# Patient Record
Sex: Female | Born: 1970 | Race: White | Hispanic: No | Marital: Married | Smoking: Former smoker
Health system: Southern US, Community
[De-identification: ages and names within clinical notes are randomized; demographics above are authoritative.]

## PROBLEM LIST (undated history)

## (undated) DIAGNOSIS — F32A Depression, unspecified: Secondary | ICD-10-CM

## (undated) DIAGNOSIS — F329 Major depressive disorder, single episode, unspecified: Secondary | ICD-10-CM

## (undated) DIAGNOSIS — R519 Headache, unspecified: Secondary | ICD-10-CM

## (undated) DIAGNOSIS — I10 Essential (primary) hypertension: Secondary | ICD-10-CM

## (undated) HISTORY — PX: CERVICAL CONE BIOPSY: SUR198

## (undated) HISTORY — PX: CYST EXCISION: SHX5701

---
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
## 2018-04-30 ENCOUNTER — Other Ambulatory Visit: Payer: Self-pay | Admitting: Obstetrics and Gynecology

## 2018-04-30 DIAGNOSIS — Z1231 Encounter for screening mammogram for malignant neoplasm of breast: Secondary | ICD-10-CM

## 2018-06-14 ENCOUNTER — Ambulatory Visit: Payer: 59

## 2018-09-04 ENCOUNTER — Other Ambulatory Visit: Payer: Self-pay

## 2018-09-04 ENCOUNTER — Ambulatory Visit
Admission: RE | Admit: 2018-09-04 | Discharge: 2018-09-04 | Disposition: A | Discharge status: Home / Self Care | Payer: 59 | Source: Ambulatory Visit | Attending: Obstetrics and Gynecology | Admitting: Obstetrics and Gynecology

## 2018-09-04 DIAGNOSIS — Z1231 Encounter for screening mammogram for malignant neoplasm of breast: Secondary | ICD-10-CM | POA: Diagnosis present

## 2019-02-17 ENCOUNTER — Other Ambulatory Visit: Payer: Self-pay

## 2019-02-17 ENCOUNTER — Encounter
Admission: RE | Admit: 2019-02-17 | Discharge: 2019-02-17 | Disposition: A | Discharge status: Home / Self Care | Payer: 59 | Source: Ambulatory Visit | Attending: Obstetrics and Gynecology | Admitting: Obstetrics and Gynecology

## 2019-02-17 HISTORY — DX: Headache, unspecified: R51.9

## 2019-02-17 HISTORY — DX: Essential (primary) hypertension: I10

## 2019-02-17 HISTORY — DX: Depression, unspecified: F32.A

## 2019-02-17 NOTE — H&P (Signed)
Chief Complaint:   Mercedes Meyers is a 49 y.o. female here for Follow-up (repeat pap smear, ultrasound follow up pmb)  Referring provider: Genice Rouge*  History of Present Illness: Patient is clinically postmenopausal;LMPwas2/2019. However, in 09/2018 at her repeat papvisit, she reported an episode of menstrual-like bleeding on 09/08/2018 for 5 days. (Repeat pap smear indicated for patient's prior history of q3 months pap smears in [place])  Patient has undergone postmenopausal bleeding work up shown below. We discussed her ultrasound results; I do not have a high level of concern but her endometrial stripe is 4.36 mm which is just above the 20mm cutoff. However, her scarring precluded placement into the endometrial cavity. Because of the amount of bleeding in her cervical canal, I did bx this area, which may be part of the PMB.   We discussed the possibilities of atrophy, benign endometrial polyp, hyperplasia and cancer. We discussed expectant management with repeat TVUS today for measurement of endo stripe or D&C.   TVUS Today: Ut wnl Ut=4.84 x 2.90 x 3.62 cm Endometrium=4.39 mm bil ovs wnl  TVUS 10/16/2018: Ut wnl Ut=4.83 x 2.88 x 3.72 cm Endometrium=4.36 mm bil ovs wnl  EMBx: Squamous mucosa with chronic inflammation and focal changes suggestive of but not diagnostic for HPV effect. Portions of lower uterine segment. No endometrial stratum functionalis present. Consider resampling if clinically indicated.  Pertinent hx: -Hx of conization Cervix by cold knife 2012-06-14, states q3 months pap smears in [place] since -Hx of abnormal pap smear; at our annual 04/2018 she has ASSCUS and neg. HPV but prior Hx of q3 month paps & is very concerned -Mother died of cervical cancer in 06-15-95  Past Medical History:  has a past medical history of Abnormal Pap smear of cervix, Chickenpox, Hypertension, Measles, and Mumps.  Past Surgical History:  has a past surgical history  that includes other surgery 06-14-2004) and Conization Cervix By Cold Knife. Family History: family history includes Cervical cancer in her mother; Heart disease in her father. Social History:  reports that she has never smoked. She has never used smokeless tobacco. She reports current alcohol use. She reports that she does not use drugs. OB/GYN History:          OB History    Gravida  0   Para  0   Term  0   Preterm  0   AB  0   Living  0     SAB  0   TAB  0   Ectopic  0   Molar  0   Multiple  0   Live Births  0        Allergies: is allergic to penicillins. Medications:No current outpatient medications on file.  Review of Systems: No SOB, no palpitations or chest pain, no new lower extremity edema, no nausea or vomiting or bowel or bladder complaints. See HPI for gyn specific ROS.   Exam:   BP 130/82   Pulse 71   Ht 167.6 cm (5\' 6" )   Wt 63.6 kg (140 lb 3.2 oz)   BMI 22.63 kg/m   Constitutional:  General appearance: Well nourished, well developed female in no acute distress.  Neuro/psych:  Normal mood and affect. No gross motor deficits. Neck:  Supple, normal appearance.  Respiratory:  Normal respiratory effort, no use of accessory muscles Skin:  No visible rashes or external lesions Pelvic:              External genitalia: vulva /labia no lesions,  Tanner stage 5             Urethra: no prolapse             Vagina: normal physiologic d/c             Cervix: no lesions, no cervical motion tenderness               Uterus: normal size shape and contour, non-tender             Adnexa: no mass,  non-tender               Rectovaginal: external exam normal  Pap smear collected today  Impression:   The primary encounter diagnosis was PMB (postmenopausal bleeding). Diagnoses of History of abnormal cervical Pap smear, Encounter for repeat Papanicolaou smear of cervix, Endometrial thickening on ultrasound, and Cervical stenosis (uterine cervix) were also  pertinent to this visit.  Plan:   1. Postmenopausal bleeding, cervical stenosis -Reviewed ultrasound results, which were unchanged since last ultrasound. Ultrasound stripe still >31mm with postmenopausal bleeding present, will want to rule out gyn malignancy. D&C hysteroscopy indicated.  -Plan for [facility] Hysteroscopy; our surgical scheduler will set this up for her today  Diagnoses and all orders for this visit:  PMB (postmenopausal bleeding) -     PapIG, HPV, rfx 16/18 - [facility]  History of abnormal cervical Pap smear -     PapIG, HPV, rfx 16/18 - [facility]  Encounter for repeat Papanicolaou smear of cervix -     PapIG, HPV, rfx 16/18 - [facility]  Endometrial thickening on ultrasound -     PapIG, HPV, rfx 16/18 - [facility]  Cervical stenosis (uterine cervix)

## 2019-02-17 NOTE — Patient Instructions (Addendum)
Your procedure is scheduled on: 02-21-19 FRIDAY Report to Same Day Surgery 2nd floor medical mall [facility] Entrance-take elevator on left to 2nd floor.  Check in with surgery information desk.) To find out your arrival time please call [phone] between 1PM - 3PM on 02-20-19 THURSDAY  Remember: Instructions that are not followed completely may result in serious medical risk, up to and including death, or upon the discretion of your surgeon and anesthesiologist your surgery may need to be rescheduled.    _x___ 1. Do not eat food after midnight the night before your procedure. NO GUM OR CANDY AFTER MIDNIGHT. You may drink clear liquids up to 2 hours before you are scheduled to arrive at the hospital for your procedure.  Do not drink clear liquids within 2 hours of your scheduled arrival to the hospital.  Clear liquids include  --Water or Apple juice without pulp  --Gatorade  --Black Coffee or Clear Tea (No milk, no creamers, do not add anything to the coffee or Tea   ____Ensure clear carbohydrate drink on the way to the hospital for bariatric patients  _X___Ensure clear carbohydrate drink 3 hours PRIOR TO ARRIVAL TIME TO HOSPITAL    __x__ 2. No Alcohol for 24 hours before or after surgery.   __x__3. No Smoking or e-cigarettes for 24 prior to surgery.  Do not use any chewable tobacco products for at least 6 hour prior to surgery   ____  4. Bring all medications with you on the day of surgery if instructed.    __x__ 5. Notify your doctor if there is any change in your medical condition     (cold, fever, infections).    x___6. On the morning of surgery brush your teeth with toothpaste and water.  You may rinse your mouth with mouth wash if you wish.  Do not swallow any toothpaste or mouthwash.   Do not wear jewelry, make-up, hairpins, clips or nail polish.  Do not wear lotions, powders, or perfumes. You may wear deodorant.  Do not shave 48 hours prior to surgery. Men may shave face  and neck.  Do not bring valuables to the hospital.    [facility] is not responsible for any belongings or valuables.               Contacts, dentures or bridgework may not be worn into surgery.  Leave your suitcase in the car. After surgery it may be brought to your room.  For patients admitted to the hospital, discharge time is determined by your treatment team.  _  Patients discharged the day of surgery will not be allowed to drive home.  You will need someone to drive you home and stay with you the night of your procedure.    Please read over the following fact sheets that you were given:   [facility] Preparing for Surgery  ____ Take anti-hypertensive listed below, cardiac, seizure, asthma,anti-reflux and psychiatric medicines. These include:  1. NONE  2.  3.  4.  5.  6.  ____Fleets enema or Magnesium Citrate as directed.   ____ Use CHG Soap or sage wipes as directed on instruction sheet   ____ Use inhalers on the day of surgery and bring to hospital day of surgery  ____ Stop Metformin and Janumet 2 days prior to surgery.    ____ Take 1/2 of usual insulin dose the night before surgery and none on the morning surgery.   ____ Follow recommendations from Cardiologist, Pulmonologist or PCP  regarding stopping Aspirin, Coumadin, Plavix ,Eliquis, Effient, or Pradaxa, and Pletal.  X____Stop Anti-inflammatories such as Advil, Aleve, Ibuprofen, Motrin, Naproxen, Naprosyn, Goodies powders or aspirin products NOW-OK to take Tylenol    ____ Stop supplements until after surgery.     ____ Bring C-Pap to the hospital.

## 2019-02-19 ENCOUNTER — Other Ambulatory Visit: Payer: Self-pay

## 2019-02-19 ENCOUNTER — Other Ambulatory Visit
Admission: RE | Admit: 2019-02-19 | Discharge: 2019-02-19 | Disposition: A | Discharge status: Home / Self Care | Payer: 59 | Source: Ambulatory Visit | Attending: Obstetrics and Gynecology | Admitting: Obstetrics and Gynecology

## 2019-02-19 DIAGNOSIS — M4802 Spinal stenosis, cervical region: Secondary | ICD-10-CM | POA: Diagnosis not present

## 2019-02-19 DIAGNOSIS — N950 Postmenopausal bleeding: Secondary | ICD-10-CM | POA: Insufficient documentation

## 2019-02-19 DIAGNOSIS — Z20822 Contact with and (suspected) exposure to covid-19: Secondary | ICD-10-CM | POA: Diagnosis not present

## 2019-02-19 DIAGNOSIS — Z01812 Encounter for preprocedural laboratory examination: Secondary | ICD-10-CM | POA: Insufficient documentation

## 2019-02-19 LAB — CBC
HCT: 43.6 % (ref 36.0–46.0)
Hemoglobin: 14.5 g/dL (ref 12.0–15.0)
MCH: 29.1 pg (ref 26.0–34.0)
MCHC: 33.3 g/dL (ref 30.0–36.0)
MCV: 87.4 fL (ref 80.0–100.0)
Platelets: 294 10*3/uL (ref 150–400)
RBC: 4.99 MIL/uL (ref 3.87–5.11)
RDW: 12.9 % (ref 11.5–15.5)
WBC: 4.7 10*3/uL (ref 4.0–10.5)
nRBC: 0 % (ref 0.0–0.2)

## 2019-02-19 LAB — BASIC METABOLIC PANEL
Anion gap: 7 (ref 5–15)
BUN: 12 mg/dL (ref 6–20)
CO2: 29 mmol/L (ref 22–32)
Calcium: 9.2 mg/dL (ref 8.9–10.3)
Chloride: 105 mmol/L (ref 98–111)
Creatinine, Ser: 0.9 mg/dL (ref 0.44–1.00)
GFR calc Af Amer: >60 mL/min (ref >60)
GFR calc non Af Amer: >60 mL/min (ref >60)
Glucose, Bld: 90 mg/dL (ref 70–99)
Potassium: 3.8 mmol/L (ref 3.5–5.1)
Sodium: 141 mmol/L (ref 135–145)

## 2019-02-19 LAB — TYPE AND SCREEN
ABO/RH(D): A POS
Antibody Screen: NEGATIVE

## 2019-02-19 LAB — SARS CORONAVIRUS 2 (TAT 6-24 HRS): SARS Coronavirus 2: NEGATIVE

## 2019-02-21 ENCOUNTER — Encounter: Payer: Self-pay | Admitting: Obstetrics and Gynecology

## 2019-02-21 ENCOUNTER — Ambulatory Visit: Payer: Commercial Managed Care - PPO | Admitting: Anesthesiology

## 2019-02-21 ENCOUNTER — Other Ambulatory Visit: Payer: Self-pay

## 2019-02-21 ENCOUNTER — Ambulatory Visit
Admission: RE | Admit: 2019-02-21 | Discharge: 2019-02-21 | Disposition: A | Discharge status: Home / Self Care | Payer: Commercial Managed Care - PPO | Attending: Obstetrics and Gynecology | Admitting: Obstetrics and Gynecology

## 2019-02-21 ENCOUNTER — Encounter
Admission: RE | Disposition: A | Discharge status: Home / Self Care | Payer: Self-pay | Source: Home / Self Care | Attending: Obstetrics and Gynecology

## 2019-02-21 DIAGNOSIS — N950 Postmenopausal bleeding: Secondary | ICD-10-CM | POA: Diagnosis present

## 2019-02-21 DIAGNOSIS — Z87891 Personal history of nicotine dependence: Secondary | ICD-10-CM | POA: Diagnosis not present

## 2019-02-21 DIAGNOSIS — I10 Essential (primary) hypertension: Secondary | ICD-10-CM | POA: Diagnosis not present

## 2019-02-21 DIAGNOSIS — N882 Stricture and stenosis of cervix uteri: Secondary | ICD-10-CM | POA: Insufficient documentation

## 2019-02-21 HISTORY — PX: HYSTEROSCOPY WITH D & C: SHX1775

## 2019-02-21 LAB — ABO/RH: ABO/RH(D): A POS

## 2019-02-21 LAB — POCT PREGNANCY, URINE: Preg Test, Ur: NEGATIVE

## 2019-02-21 SURGERY — DILATATION AND CURETTAGE /HYSTEROSCOPY
Anesthesia: General

## 2019-02-21 MED ORDER — FENTANYL CITRATE (PF) 100 MCG/2ML IJ SOLN: 25.0000 ug | INTRAMUSCULAR | Status: DC | PRN

## 2019-02-21 MED ORDER — LACTATED RINGERS IV SOLN: INTRAVENOUS | Status: DC

## 2019-02-21 MED ORDER — MIDAZOLAM HCL 2 MG/2ML IJ SOLN
INTRAMUSCULAR | Status: DC | PRN
  Administered 2019-02-21: 2 mg via INTRAVENOUS

## 2019-02-21 MED ORDER — FENTANYL CITRATE (PF) 100 MCG/2ML IJ SOLN
INTRAMUSCULAR | Status: DC | PRN
  Administered 2019-02-21 (×4): 25 ug via INTRAVENOUS

## 2019-02-21 MED ORDER — ONDANSETRON HCL 4 MG/2ML IJ SOLN
INTRAMUSCULAR | Status: DC | PRN
  Administered 2019-02-21: 4 mg via INTRAVENOUS

## 2019-02-21 MED ORDER — LIDOCAINE HCL (PF) 2 % IJ SOLN
INTRAMUSCULAR | Status: AC
  Filled 2019-02-21: qty 10

## 2019-02-21 MED ORDER — SILVER NITRATE-POT NITRATE 75-25 % EX MISC
CUTANEOUS | Status: DC | PRN
  Administered 2019-02-21: 4 via TOPICAL

## 2019-02-21 MED ORDER — PROPOFOL 10 MG/ML IV BOLUS
INTRAVENOUS | Status: AC
  Filled 2019-02-21: qty 20

## 2019-02-21 MED ORDER — FAMOTIDINE 20 MG PO TABS: 20.0000 mg | ORAL_TABLET | Freq: Once | ORAL | Status: AC

## 2019-02-21 MED ORDER — FAMOTIDINE 20 MG PO TABS
ORAL_TABLET | ORAL | Status: AC
  Administered 2019-02-21: 20 mg via ORAL
  Filled 2019-02-21: qty 1

## 2019-02-21 MED ORDER — DEXAMETHASONE SODIUM PHOSPHATE 10 MG/ML IJ SOLN
INTRAMUSCULAR | Status: DC | PRN
  Administered 2019-02-21: 10 mg via INTRAVENOUS

## 2019-02-21 MED ORDER — FENTANYL CITRATE (PF) 100 MCG/2ML IJ SOLN
INTRAMUSCULAR | Status: AC
  Filled 2019-02-21: qty 2

## 2019-02-21 MED ORDER — LIDOCAINE HCL (CARDIAC) PF 100 MG/5ML IV SOSY
PREFILLED_SYRINGE | INTRAVENOUS | Status: DC | PRN
  Administered 2019-02-21: 60 mg via INTRAVENOUS

## 2019-02-21 MED ORDER — PROPOFOL 10 MG/ML IV BOLUS
INTRAVENOUS | Status: DC | PRN
  Administered 2019-02-21: 130 mg via INTRAVENOUS

## 2019-02-21 MED ORDER — OXYCODONE HCL 5 MG/5ML PO SOLN: 5.0000 mg | Freq: Once | ORAL | Status: DC | PRN

## 2019-02-21 MED ORDER — MIDAZOLAM HCL 2 MG/2ML IJ SOLN
INTRAMUSCULAR | Status: AC
  Filled 2019-02-21: qty 2

## 2019-02-21 MED ORDER — OXYCODONE HCL 5 MG PO TABS: 5.0000 mg | ORAL_TABLET | Freq: Once | ORAL | Status: DC | PRN

## 2019-02-21 MED ORDER — KETOROLAC TROMETHAMINE 30 MG/ML IJ SOLN
INTRAMUSCULAR | Status: DC | PRN
  Administered 2019-02-21: 30 mg via INTRAVENOUS

## 2019-02-21 SURGICAL SUPPLY — 21 items
BAG INFUSER PRESSURE 100CC (MISCELLANEOUS) ×2 IMPLANT
CANISTER SUCT 3000ML PPV (MISCELLANEOUS) ×2 IMPLANT
CATH ROBINSON RED A/P 16FR (CATHETERS) ×2 IMPLANT
COVER WAND RF STERILE (DRAPES) ×2 IMPLANT
DEVICE MYOSURE LITE (MISCELLANEOUS) ×2 IMPLANT
ELECT REM PT RETURN 9FT ADLT (ELECTROSURGICAL) ×2
ELECTRODE REM PT RTRN 9FT ADLT (ELECTROSURGICAL) ×1 IMPLANT
GLOVE BIO SURGEON STRL SZ7 (GLOVE) ×2 IMPLANT
GLOVE INDICATOR 7.5 STRL GRN (GLOVE) ×2 IMPLANT
GOWN STRL REUS W/ TWL LRG LVL3 (GOWN DISPOSABLE) ×2 IMPLANT
GOWN STRL REUS W/TWL LRG LVL3 (GOWN DISPOSABLE) ×2
IV LACTATED RINGER IRRG 3000ML (IV SOLUTION) ×1
IV LR IRRIG 3000ML ARTHROMATIC (IV SOLUTION) ×1 IMPLANT
KIT PROCEDURE FLUENT (KITS) ×2 IMPLANT
KIT TURNOVER CYSTO (KITS) ×2 IMPLANT
PACK DNC HYST (MISCELLANEOUS) ×2 IMPLANT
PAD OB MATERNITY 4.3X12.25 (PERSONAL CARE ITEMS) ×2 IMPLANT
PAD PREP 24X41 OB/GYN DISP (PERSONAL CARE ITEMS) ×2 IMPLANT
SOL .9 NS 3000ML IRR  AL (IV SOLUTION) ×1
SOL .9 NS 3000ML IRR UROMATIC (IV SOLUTION) ×1 IMPLANT
TUBING CONNECTING 10 (TUBING) ×2 IMPLANT

## 2019-02-21 NOTE — Interval H&P Note (Signed)
History and Physical Interval Note:  02/21/2019 12:25 PM  Mercedes Meyers  has presented today for surgery, with the diagnosis of postmenopausal bleeding, cervical stenosis.  The various methods of treatment have been discussed with the patient and family. After consideration of risks, benefits and other options for treatment, the patient has consented to  Procedure(s): DILATATION AND CURETTAGE /HYSTEROSCOPY (N/A) as a surgical intervention.  The patient's history has been reviewed, patient examined, no change in status, stable for surgery.  I have reviewed the patient's chart and labs.  Questions were answered to the patient's satisfaction.     Christeen Douglas

## 2019-02-21 NOTE — Transfer of Care (Signed)
Immediate Anesthesia Transfer of Care Note  Patient: Mercedes Meyers  Procedure(s) Performed: DILATATION AND CURETTAGE /HYSTEROSCOPY (N/A )  Patient Location: PACU  Anesthesia Type:General  Level of Consciousness: oriented and sedated  Airway & Oxygen Therapy: Patient Spontanous Breathing  Post-op Assessment: Report given to RN  Post vital signs: stable  Last Vitals:  Vitals Value Taken Time  BP 154/83 02/21/19 1352  Temp 36.2 C 02/21/19 1352  Pulse 80 02/21/19 1356  Resp 18 02/21/19 1356  SpO2 93 % 02/21/19 1356  Vitals shown include unvalidated device data.  Last Pain:  Vitals:   02/21/19 1052  TempSrc: Temporal  PainSc: 0-No pain         Complications: No apparent anesthesia complications

## 2019-02-21 NOTE — Anesthesia Postprocedure Evaluation (Signed)
Anesthesia Post Note  Patient: Mercedes Meyers  Procedure(s) Performed: DILATATION AND CURETTAGE /HYSTEROSCOPY (N/A )  Patient location during evaluation: PACU Anesthesia Type: General Level of consciousness: awake and alert and oriented Pain management: pain level controlled Vital Signs Assessment: post-procedure vital signs reviewed and stable Respiratory status: spontaneous breathing, nonlabored ventilation and respiratory function stable Cardiovascular status: blood pressure returned to baseline and stable Postop Assessment: no signs of nausea or vomiting Anesthetic complications: no     Last Vitals:  Vitals:   02/21/19 1407 02/21/19 1422  BP: (!) 158/88 (!) 142/76  Pulse: (!) 55 63  Resp: 10 20  Temp:    SpO2: 99% 100%    Last Pain:  Vitals:   02/21/19 1422  TempSrc:   PainSc: 0-No pain                 Marvin Grabill

## 2019-02-21 NOTE — Discharge Instructions (Signed)

## 2019-02-21 NOTE — Op Note (Signed)
Operative Report Hysteroscopy with Dilation and Curettage   Indications: Postmenopausal bleeding, inability to sample the endometrium in the outpatient setting   Pre-operative Diagnosis: Postmenopausal bleeding, cervical stenosis   Post-operative Diagnosis: same.  Procedure: 1. Exam under anesthesia 2. Fractional D&C using myosure device 3. Hysteroscopy  Surgeon: Christeen Douglas, MD  Assistant(s):  None  Anesthesia: General LMA anesthesia  Anesthesiologist: Alver Fisher, MD Anesthesiologist: Alver Fisher, MD CRNA: Rodney Booze, CRNA  Estimated Blood Loss:  Minimal         Intraoperative medications: toradol         Total IV Fluids:  Urine Output:  Total Fluid Deficit:  110 mL          Specimens: Endocervical curettings, endometrial curettings, myosure currettings          Complications:  None; patient tolerated the procedure well.         Disposition: PACU - hemodynamically stable.         Condition: stable  Findings: Uterus measuring 5 cm by sound; normal cervix, vagina, perineum. Thin atrophic endometrium  Indication for procedure/Consents: 49 y.o. F on file.  here for scheduled surgery for the aforementioned diagnoses.   Risks of surgery were discussed with the patient including but not limited to: bleeding which may require transfusion; infection which may require antibiotics; injury to uterus or surrounding organs; intrauterine scarring which may impair future fertility; need for additional procedures including laparotomy or laparoscopy; and other postoperative/anesthesia complications. Written informed consent was obtained.    Procedure Details:   D&C/ Myosure  The patient was taken to the operating room where anesthesia was administered and was found to be adequate. After a formal and adequate timeout was performed, she was placed in the dorsal lithotomy position and examined with the above findings. She was then prepped and draped in the  sterile manner. Her bladder was catheterized for an estimated amount of clear, yellow urine. A weighed speculum was then placed in the patient's vagina and a single tooth tenaculum was applied to the anterior lip of the cervix.  Her cervix was serially dilated to 15 [place] using Hanks dilators. This was difficult and required visualization with the hysteroscope for an anteverted uterus that was initially difficult to enter. An ECC was performed. The hysteroscope was introduced under direct observation.  Using lactated ringers as a distention medium to reveal the above findings. The uterine cavity was carefully examined, both ostia were recognized, and I used the myosure device because I was unable to get to the cavity by feel alone.  After further careful visualization of the uterine cavity, the hysteroscope was removed under direct visualization.  A sharp curettage was then able to be performed until there was a gritty texture in all four quadrants. The tenaculum was removed from the anterior lip of the cervix and the vaginal speculum was removed after applying silver nitrate for good hemostasis.   The patient tolerated the procedure well and was taken to the recovery area awake and in stable condition. She received iv acetaminophen and Toradol prior to leaving the OR.  The patient will be discharged to home as per PACU criteria. Routine postoperative instructions given. She was prescribed Ibuprofen and Colace. She will follow up in the clinic in two weeks for postoperative evaluation.

## 2019-02-21 NOTE — Anesthesia Preprocedure Evaluation (Signed)
Anesthesia Evaluation  Patient identified by MRN, date of birth, ID band Patient awake    Reviewed: Allergy & Precautions, H&P , NPO status , Patient's Chart, lab work & pertinent test results  History of Anesthesia Complications Negative for: history of anesthetic complications  Airway Mallampati: II  TM Distance: >3 FB Neck ROM: full    Dental  (+) Chipped, Poor Dentition   Pulmonary neg pulmonary ROS, neg shortness of breath, former smoker,           Cardiovascular Exercise Tolerance: Good hypertension, (-) angina(-) Past MI and (-) DOE      Neuro/Psych  Headaches, PSYCHIATRIC DISORDERS    GI/Hepatic negative GI ROS, Neg liver ROS, neg GERD  ,  Endo/Other  negative endocrine ROS  Renal/GU      Musculoskeletal   Abdominal   Peds  Hematology negative hematology ROS (+)   Anesthesia Other Findings Past Medical History: No date: Depression     Comment:  no meds No date: Headache     Comment:  h/o migraines No date: Hypertension     Comment:  HAS BEEN OFF MEDS X 2 YEARS BY PCP-PT WAS IN [place]               IS WELL CONTROLLED NOW  Past Surgical History: No date: CERVICAL CONE BIOPSY     Comment:  IN [place] No date: CYST EXCISION     Comment:  next to nose  BMI    Body Mass Index: 22.63 kg/m      Reproductive/Obstetrics negative OB ROS                             Anesthesia Physical Anesthesia Plan  ASA: II  Anesthesia Plan: General LMA   Post-op Pain Management:    Induction: Intravenous  PONV Risk Score and Plan: Dexamethasone, Ondansetron, Midazolam and Treatment may vary due to age or medical condition  Airway Management Planned: LMA  Additional Equipment:   Intra-op Plan:   Post-operative Plan: Extubation in OR  Informed Consent: I have reviewed the patients History and Physical, chart, labs and discussed the procedure including the risks, benefits and  alternatives for the proposed anesthesia with the patient or authorized representative who has indicated his/her understanding and acceptance.     Dental Advisory Given  Plan Discussed with: Anesthesiologist, CRNA and Surgeon  Anesthesia Plan Comments: (Patient consented for risks of anesthesia including but not limited to:  - adverse reactions to medications - damage to teeth, lips or other oral mucosa - sore throat or hoarseness - Damage to heart, brain, lungs or loss of life  Patient voiced understanding.)        Anesthesia Quick Evaluation

## 2019-02-24 LAB — SURGICAL PATHOLOGY

## 2019-03-11 ENCOUNTER — Other Ambulatory Visit: Payer: Self-pay | Admitting: Obstetrics and Gynecology

## 2019-03-11 DIAGNOSIS — N6320 Unspecified lump in the left breast, unspecified quadrant: Secondary | ICD-10-CM

## 2019-03-12 ENCOUNTER — Other Ambulatory Visit: Payer: Self-pay | Admitting: Obstetrics and Gynecology

## 2019-03-12 DIAGNOSIS — N6320 Unspecified lump in the left breast, unspecified quadrant: Secondary | ICD-10-CM

## 2019-03-19 ENCOUNTER — Ambulatory Visit
Admission: RE | Admit: 2019-03-19 | Discharge: 2019-03-19 | Disposition: A | Discharge status: Home / Self Care | Payer: Commercial Managed Care - PPO | Source: Ambulatory Visit | Attending: Obstetrics and Gynecology | Admitting: Obstetrics and Gynecology

## 2019-03-19 DIAGNOSIS — N6320 Unspecified lump in the left breast, unspecified quadrant: Secondary | ICD-10-CM

## 2019-04-02 ENCOUNTER — Encounter: Payer: Self-pay | Admitting: *Deleted

## 2019-07-04 ENCOUNTER — Other Ambulatory Visit: Payer: Self-pay

## 2019-07-04 ENCOUNTER — Ambulatory Visit: Payer: Commercial Managed Care - PPO | Attending: Oncology

## 2019-07-04 DIAGNOSIS — Z23 Encounter for immunization: Secondary | ICD-10-CM

## 2019-07-04 NOTE — Progress Notes (Signed)
   Covid-19 Vaccination Clinic  Name:  Mercedes Meyers    MRN: [id number] DOB: April 16, 1970  07/04/2019  [name] was observed post Covid-19 immunization for 15 minutes without incident. She was provided with Vaccine Information Sheet and instruction to access the V-Safe system.   [name] was instructed to call 911 with any severe reactions post vaccine: Marland Kitchen Difficulty breathing  . Swelling of face and throat  . A fast heartbeat  . A bad rash all over body  . Dizziness and weakness   Immunizations Administered    Name Date Dose VIS Date Route   Pfizer COVID-19 Vaccine 07/04/2019 11:31 AM 0.3 mL 04/09/2018 Intramuscular   Manufacturer: [employer], [employer]   Lot: [id number]   NDC: [address]-3

## 2019-07-25 ENCOUNTER — Ambulatory Visit: Payer: Commercial Managed Care - PPO | Attending: Internal Medicine

## 2019-07-25 DIAGNOSIS — Z23 Encounter for immunization: Secondary | ICD-10-CM

## 2020-07-21 IMAGING — US US BREAST*L* LIMITED INC AXILLA
1 series · 2 of 2 positions shown · non-contrast
Comparison: Previous exam(s).

CLINICAL DATA: 48-year-old female complaining of a palpable
abnormality in the left breast.

EXAM:
DIGITAL DIAGNOSTIC LEFT MAMMOGRAM WITH CAD AND TOMO
ULTRASOUND LEFT BREAST

[Series 1: us breast*left* limited inc axilla · 0.08mm/px · 2 of 2 slices shown]
[im 1/2]
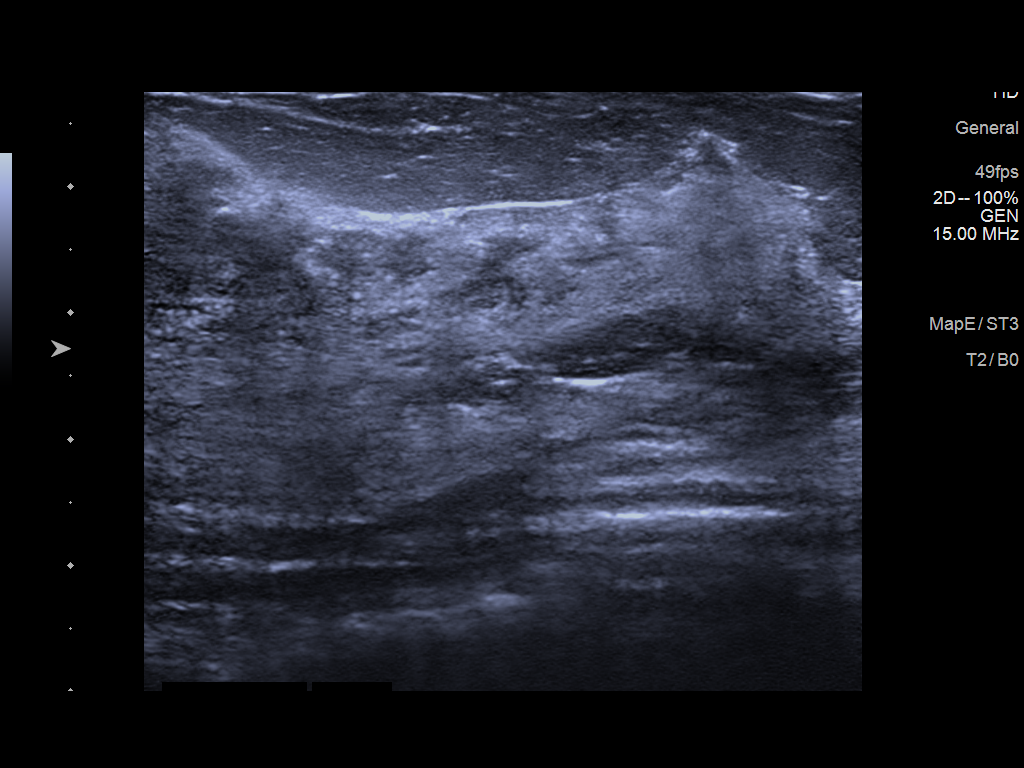
[im 2/2]
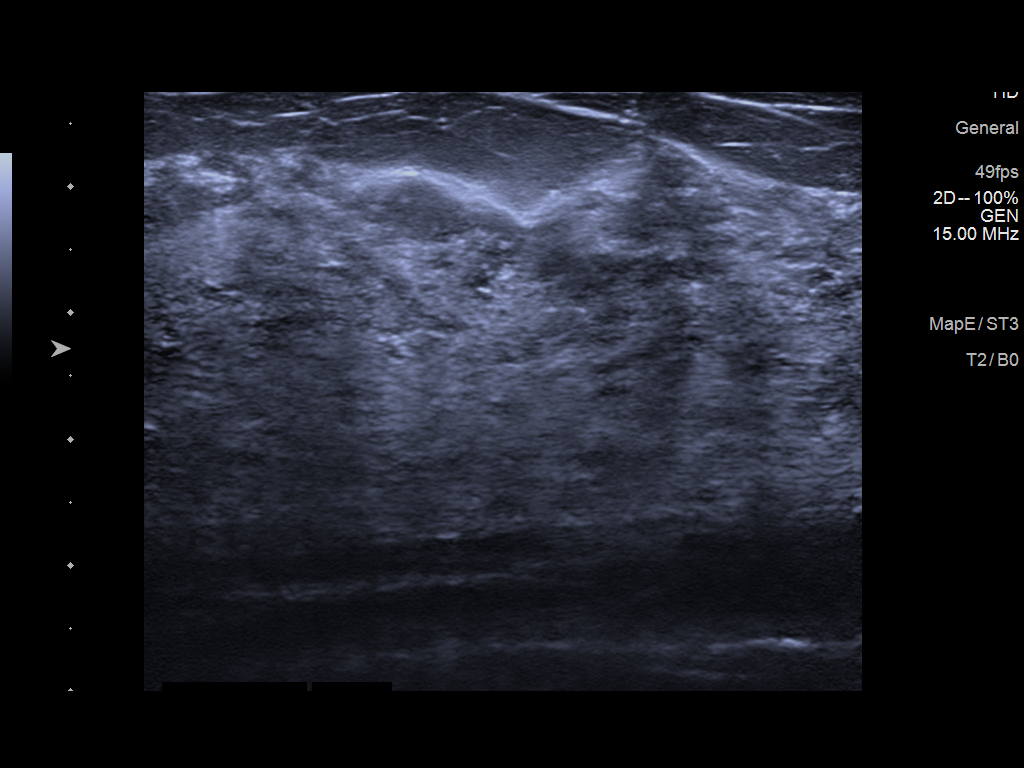

[2 of 2 positions shown; findings below may reference images not displayed]

ACR Breast Density Category c: The breast tissue is heterogeneously
dense, which may obscure small masses.
FINDINGS: Parenchymal pattern is stable compared to prior exam dated
09/04/2018. No suspicious mass or malignant type microcalcifications
identified.

Mammographic images were processed with CAD.

On physical exam, I do not palpate a discrete mass in the area of
clinical concern in the left breast at 3 o'clock 4 cm from the
nipple.

Targeted ultrasound is performed, showing normal fibroglandular
tissue in the of clinical concern at 3 o'clock 4 cm from the nipple.
IMPRESSION: No evidence of malignancy in the left breast.

RECOMMENDATION:
Bilateral screening mammogram in Wednesday August, 2019 is recommended. The
importance of self-breast examination was discussed with the
patient.

I have discussed the findings and recommendations with the patient.
If applicable, a reminder letter will be sent to the patient
regarding the next appointment.

BI-RADS CATEGORY  1: Negative.

## 2020-07-21 IMAGING — MG MM DIGITAL DIAGNOSTIC UNILAT*L* W/ TOMO W/ CAD
6 series · 6 of 18 positions shown · non-contrast
Comparison: Previous exam(s).

CLINICAL DATA: 48-year-old female complaining of a palpable
abnormality in the left breast.

EXAM:
DIGITAL DIAGNOSTIC LEFT MAMMOGRAM WITH CAD AND TOMO
ULTRASOUND LEFT BREAST

[L TAN synth-2D]
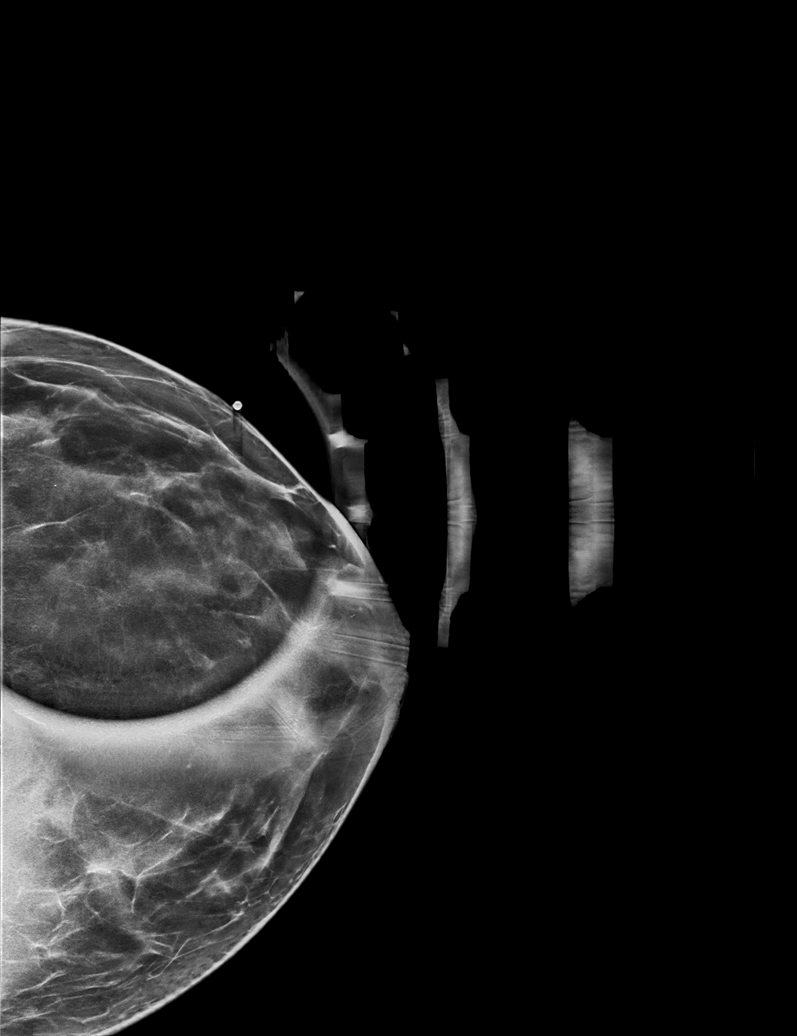

[L MLO synth-2D]
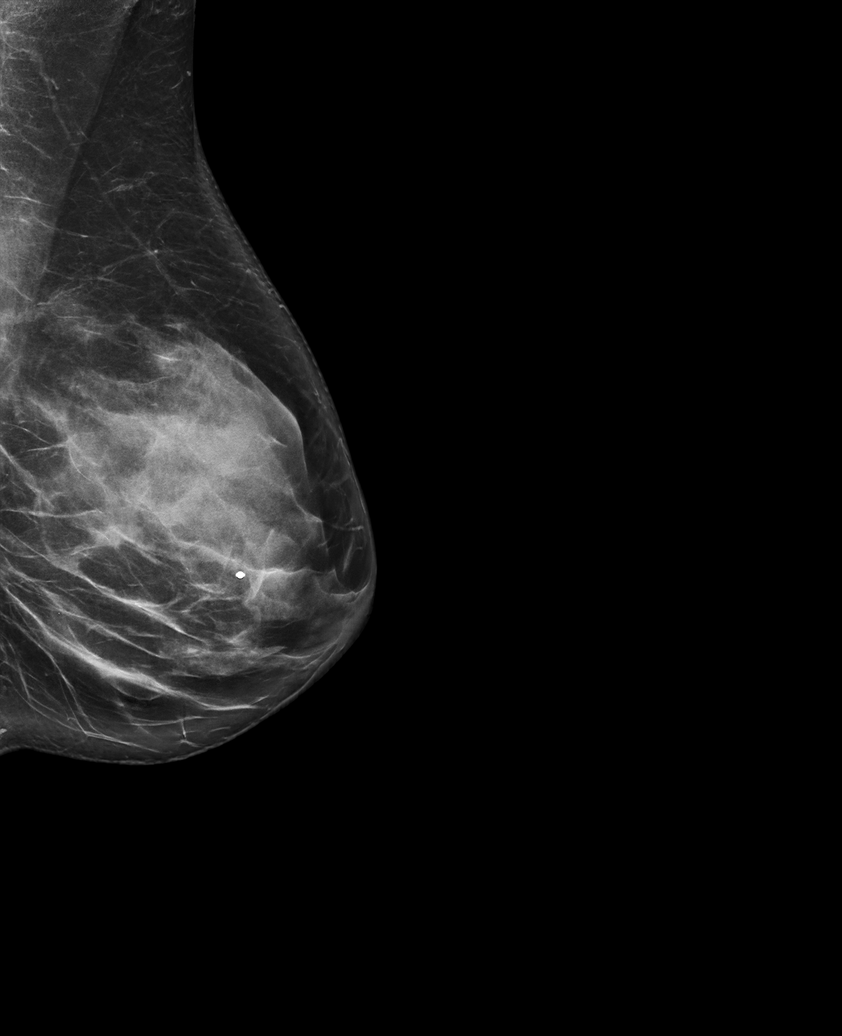

[L CC synth-2D]
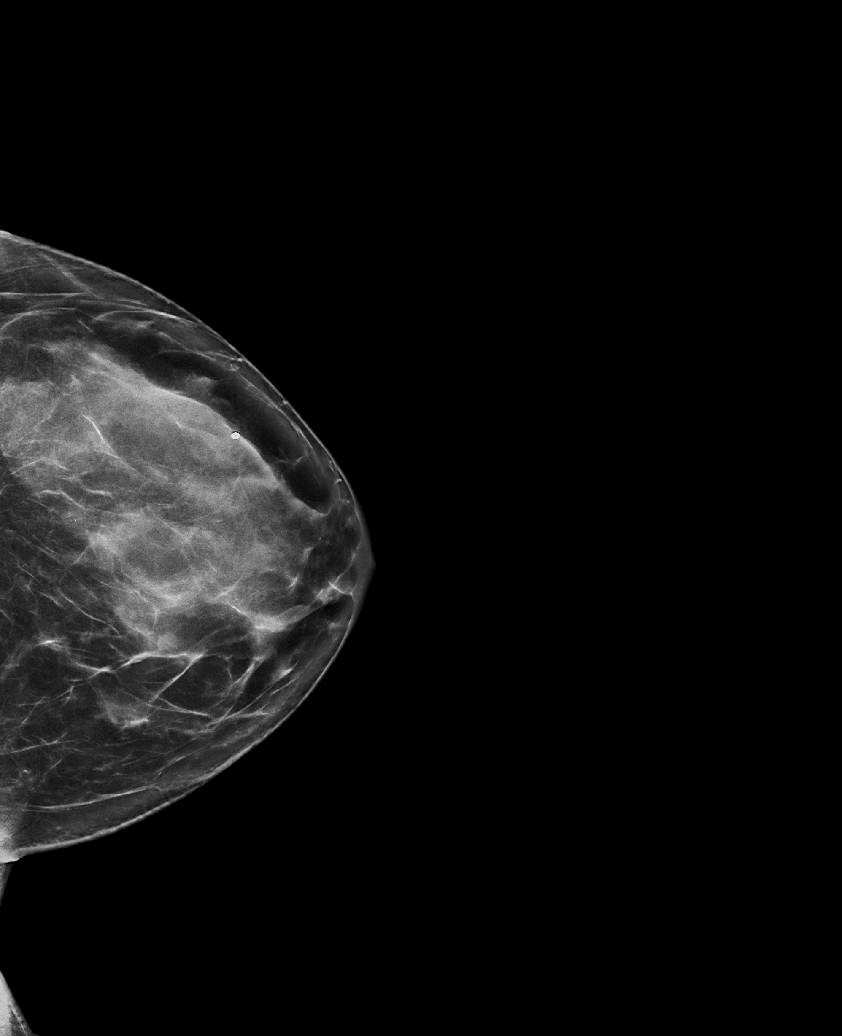

[L CC tomo · tomo slice 41/81.0]
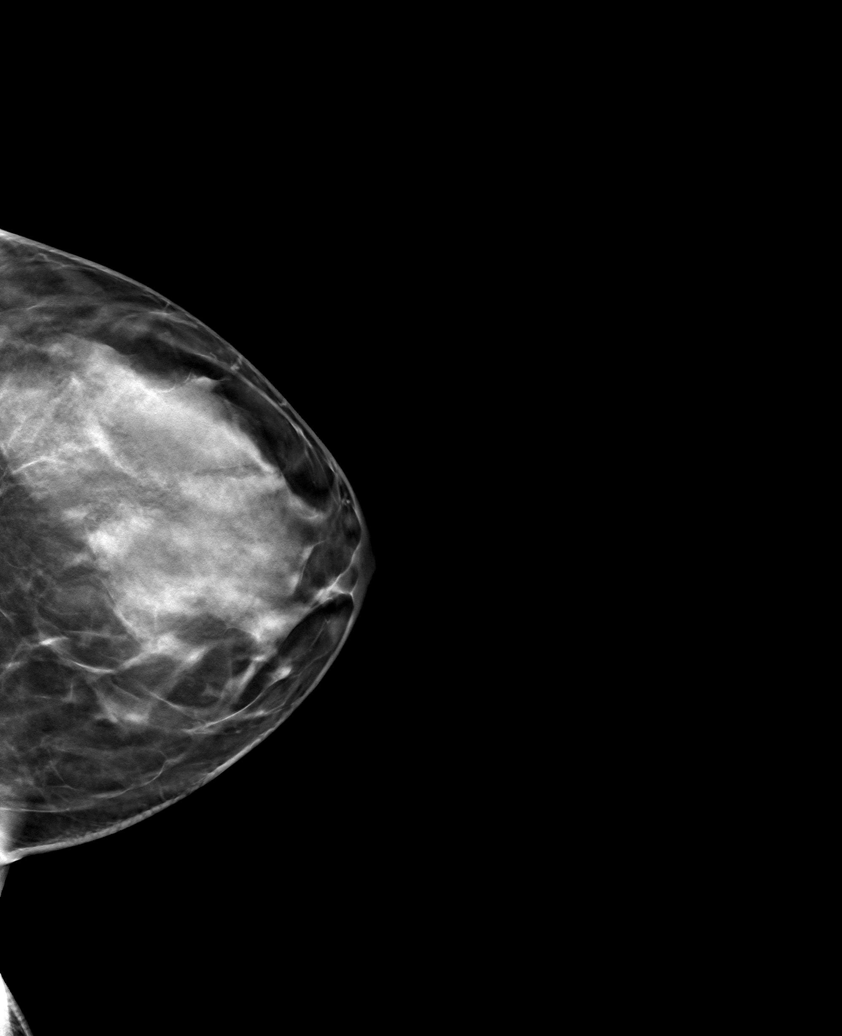

[L MLO tomo · tomo slice 41/82.0]
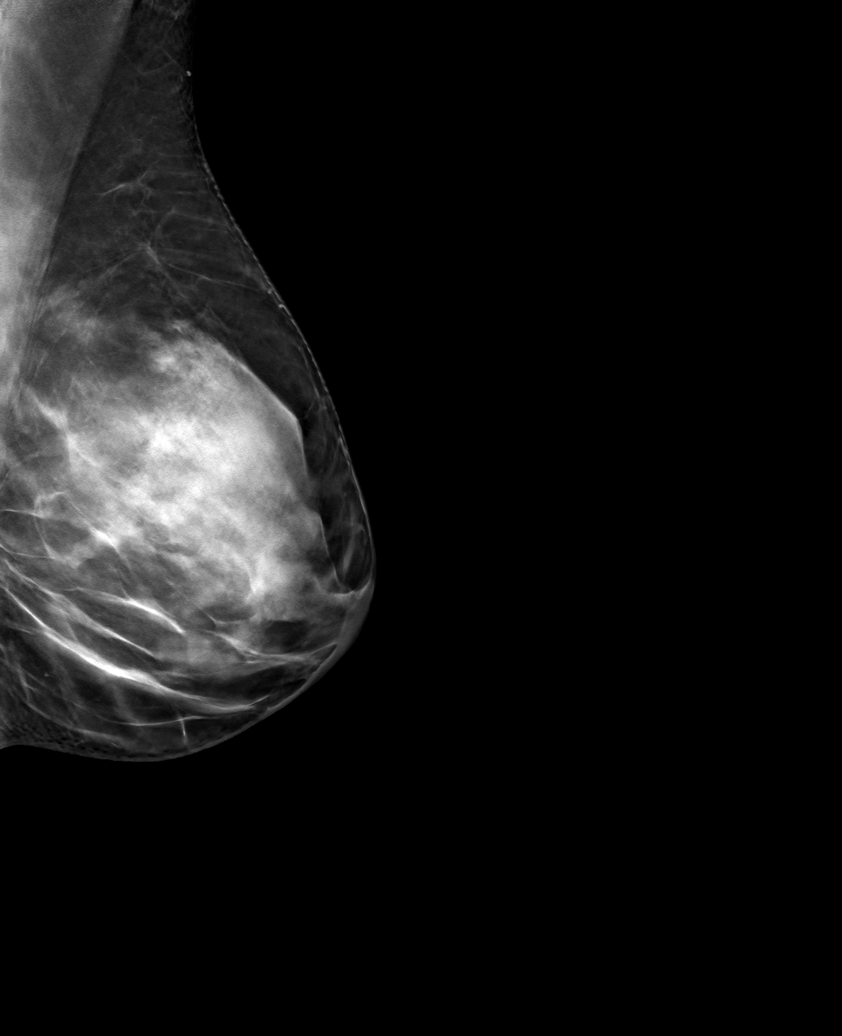

[L TAN tomo · tomo slice 33/66.0]
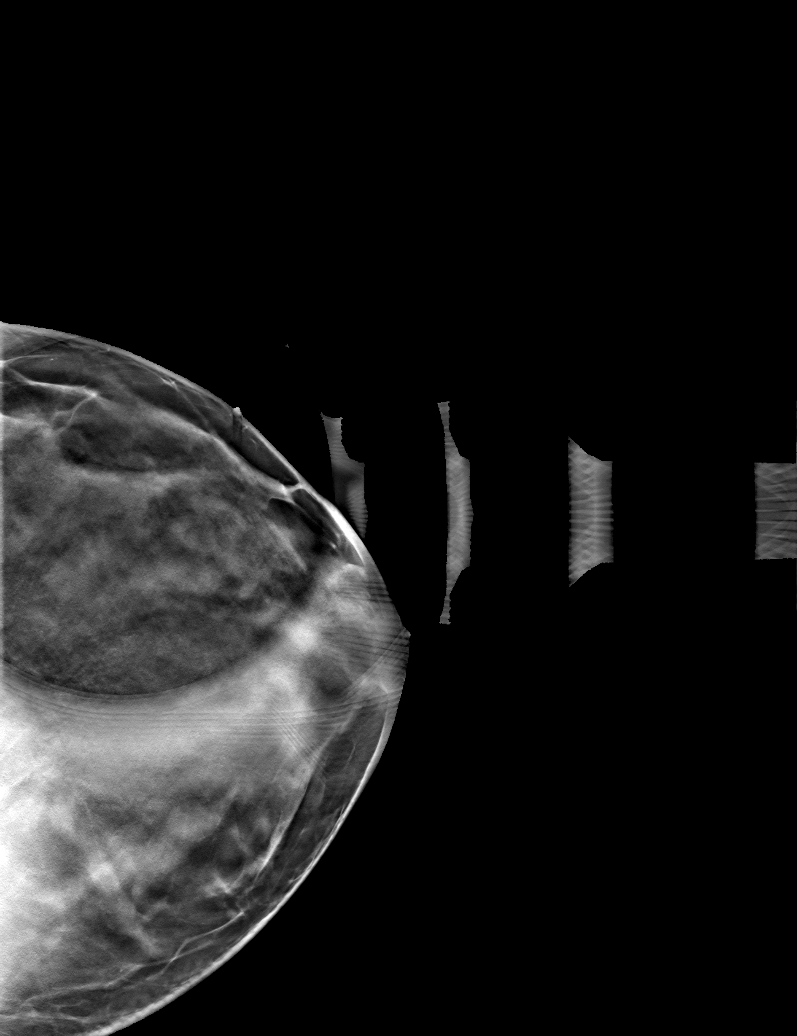

[6 of 18 positions shown; findings below may reference images not displayed]

ACR Breast Density Category c: The breast tissue is heterogeneously
dense, which may obscure small masses.
FINDINGS: Parenchymal pattern is stable compared to prior exam dated
09/04/2018. No suspicious mass or malignant type microcalcifications
identified.

Mammographic images were processed with CAD.

On physical exam, I do not palpate a discrete mass in the area of
clinical concern in the left breast at 3 o'clock 4 cm from the
nipple.

Targeted ultrasound is performed, showing normal fibroglandular
tissue in the of clinical concern at 3 o'clock 4 cm from the nipple.
IMPRESSION: No evidence of malignancy in the left breast.

RECOMMENDATION:
Bilateral screening mammogram in Wednesday August, 2019 is recommended. The
importance of self-breast examination was discussed with the
patient.

I have discussed the findings and recommendations with the patient.
If applicable, a reminder letter will be sent to the patient
regarding the next appointment.

BI-RADS CATEGORY  1: Negative.

## 2021-08-08 ENCOUNTER — Other Ambulatory Visit: Payer: Self-pay | Admitting: Obstetrics and Gynecology

## 2021-08-08 DIAGNOSIS — Z1231 Encounter for screening mammogram for malignant neoplasm of breast: Secondary | ICD-10-CM

## 2021-08-29 ENCOUNTER — Ambulatory Visit
Admission: RE | Admit: 2021-08-29 | Discharge: 2021-08-29 | Disposition: A | Discharge status: Home / Self Care | Payer: Commercial Managed Care - PPO | Source: Ambulatory Visit | Attending: Obstetrics and Gynecology | Admitting: Obstetrics and Gynecology

## 2021-08-29 DIAGNOSIS — Z1231 Encounter for screening mammogram for malignant neoplasm of breast: Secondary | ICD-10-CM | POA: Diagnosis present

## 2022-01-28 ENCOUNTER — Ambulatory Visit
Admission: EM | Admit: 2022-01-28 | Discharge: 2022-01-28 | Disposition: A | Discharge status: Home / Self Care | Payer: Managed Care, Other (non HMO) | Attending: Emergency Medicine | Admitting: Emergency Medicine

## 2022-01-28 DIAGNOSIS — Z1152 Encounter for screening for COVID-19: Secondary | ICD-10-CM | POA: Insufficient documentation

## 2022-01-28 DIAGNOSIS — B349 Viral infection, unspecified: Secondary | ICD-10-CM | POA: Diagnosis not present

## 2022-01-28 DIAGNOSIS — R509 Fever, unspecified: Secondary | ICD-10-CM | POA: Diagnosis present

## 2022-01-28 LAB — RESP PANEL BY RT-PCR (FLU A&B, COVID) ARPGX2
Influenza A by PCR: POSITIVE — AB
Influenza B by PCR: NEGATIVE
SARS Coronavirus 2 by RT PCR: NEGATIVE

## 2022-01-28 NOTE — ED Provider Notes (Signed)
Renaldo Fiddler    CSN: [id number] Arrival date & time: 01/28/22  1517      History   Chief Complaint Chief Complaint  Patient presents with   URI    HPI Mercedes Meyers is a 51 y.o. female.  Patient presents with fever, congestion, cough x 3 days.  Her symptoms are improving; no fever today.  Treatment today with DayQuil.  She denies rash, sore throat shortness of breath, vomiting, diarrhea, or other symptoms.  Her medical history includes hypertension.  The history is provided by the patient and medical records.    Past Medical History:  Diagnosis Date   Depression    no meds   Headache    h/o migraines   Hypertension    HAS BEEN OFF MEDS X 2 YEARS BY PCP-PT WAS IN [place]-BP IS WELL CONTROLLED NOW    Patient Active Problem List   Diagnosis Date Noted   Fever, unspecified 01/28/2022    Past Surgical History:  Procedure Laterality Date   CERVICAL CONE BIOPSY     IN [place]   CYST EXCISION     next to nose   HYSTEROSCOPY WITH D & C N/A 02/21/2019   Procedure: DILATATION AND CURETTAGE /HYSTEROSCOPY;  Surgeon: Christeen Douglas, MD;  Location: [facility];  Service: Gynecology;  Laterality: N/A;    OB History   No obstetric history on file.      Home Medications    Prior to Admission medications   Not on File    Family History Family History  Problem Relation Age of Onset   Breast cancer Neg Hx     Social History Social History   Tobacco Use   Smoking status: Former    Packs/day: 1.00    Years: 15.00    Total pack years: 15.00    Types: Cigarettes    Quit date: 02/17/2004    Years since quitting: 17.9   Smokeless tobacco: Never  Vaping Use   Vaping Use: Never used  Substance Use Topics   Alcohol use: Yes    Comment: occ wine   Drug use: Never     Allergies   Other and Penicillins   Review of Systems Review of Systems  Constitutional:  Positive for fatigue. Negative for chills and fever.  HENT:  Positive for congestion. Negative  for ear pain and sore throat.   Respiratory:  Positive for cough. Negative for shortness of breath.   Cardiovascular:  Negative for chest pain and palpitations.  Gastrointestinal:  Negative for diarrhea and vomiting.  Skin:  Negative for color change and rash.  All other systems reviewed and are negative.    Physical Exam Triage Vital Signs ED Triage Vitals  Enc Vitals Group     BP      Pulse      Resp      Temp      Temp src      SpO2      Weight      Height      Head Circumference      Peak Flow      Pain Score      Pain Loc      Pain Edu?      Excl. in GC?    No data found.  Updated Vital Signs BP 137/88   Pulse 80   Temp 99 F (37.2 C)   Resp 18   Ht 5' 6.93" (1.7 m)   Wt 135 lb 5.8 oz (61.4  kg)   LMP 03/19/2017 Comment: STARTED BLEEDING AGAIN 09-2018 AFTER PAP SMEAR  SpO2 97%   BMI 21.25 kg/m   Visual Acuity Right Eye Distance:   Left Eye Distance:   Bilateral Distance:    Right Eye Near:   Left Eye Near:    Bilateral Near:     Physical Exam Vitals and nursing note reviewed.  Constitutional:      General: She is not in acute distress.    Appearance: Normal appearance. She is well-developed. She is not ill-appearing.  HENT:     Right Ear: Tympanic membrane normal.     Left Ear: Tympanic membrane normal.     Nose: Nose normal.     Mouth/Throat:     Mouth: Mucous membranes are moist.     Pharynx: Oropharynx is clear.  Cardiovascular:     Rate and Rhythm: Normal rate and regular rhythm.     Heart sounds: Normal heart sounds.  Pulmonary:     Effort: Pulmonary effort is normal. No respiratory distress.     Breath sounds: Normal breath sounds.  Musculoskeletal:     Cervical back: Neck supple.  Skin:    General: Skin is warm and dry.  Neurological:     Mental Status: She is alert.  Psychiatric:        Mood and Affect: Mood normal.        Behavior: Behavior normal.      UC Treatments / Results  Labs (all labs ordered are listed, but only  abnormal results are displayed) Labs Reviewed  RESP PANEL BY RT-PCR (FLU A&B, COVID) [id number]    EKG   Radiology No results found.  Procedures Procedures (including critical care time)  Medications Ordered in UC Medications - No data to display  Initial Impression / Assessment and Plan / UC Course  I have reviewed the triage vital signs and the nursing notes.  Pertinent labs & imaging results that were available during my care of the patient were reviewed by me and considered in my medical decision making (see chart for details).   Viral illness.  COVID and flu pending.  If COVID positive, would recommend treatment with Paxlovid or molnupiravir.  GFR 88 on 12/28/2021.  Patient has history of hypertension.  If flu positive, patient is outside the window for treatment.  Discussed symptomatic treatment including Tylenol or ibuprofen, rest, hydration.  Instructed patient to follow up with her PCP if her symptoms are not improving.  She agrees to plan of care.    Final Clinical Impressions(s) / UC Diagnoses   Final diagnoses:  Fever, unspecified  Viral illness     Discharge Instructions      Your COVID and Flu tests are pending.    Take Tylenol or ibuprofen as needed for fever or discomfort.  Rest and keep yourself hydrated.    Follow-up with your primary care provider if your symptoms are not improving.         ED Prescriptions   None    PDMP not reviewed this encounter.   Mickie Bail, NP 01/28/22 [phone]

## 2022-01-28 NOTE — ED Triage Notes (Signed)
Patient to Urgent Care with complaints of chest and head congestion x3 days. Also complaints of wet sounding cough, has been unable to produce anything. Reports max fever has been 39'C.   Has been taking dayquil/ nyquil.

## 2022-01-28 NOTE — Discharge Instructions (Addendum)
Your COVID and Flu tests are pending.    Take Tylenol or ibuprofen as needed for fever or discomfort.  Rest and keep yourself hydrated.    Follow-up with your primary care provider if your symptoms are not improving.     

## 2022-01-29 ENCOUNTER — Telehealth: Payer: Self-pay | Admitting: Emergency Medicine

## 2022-01-29 NOTE — Telephone Encounter (Signed)
Telephone call to patient to discuss positive influenza test.  Left message to call back.  (Patient is outside the window for treatment.)

## 2022-08-10 ENCOUNTER — Other Ambulatory Visit: Payer: Self-pay | Admitting: Obstetrics and Gynecology

## 2022-08-10 DIAGNOSIS — Z1231 Encounter for screening mammogram for malignant neoplasm of breast: Secondary | ICD-10-CM

## 2022-09-04 ENCOUNTER — Ambulatory Visit
Admission: RE | Admit: 2022-09-04 | Discharge: 2022-09-04 | Disposition: A | Discharge status: Home / Self Care | Payer: Managed Care, Other (non HMO) | Source: Ambulatory Visit | Attending: Obstetrics and Gynecology | Admitting: Obstetrics and Gynecology

## 2022-09-04 DIAGNOSIS — Z1231 Encounter for screening mammogram for malignant neoplasm of breast: Secondary | ICD-10-CM | POA: Insufficient documentation

## 2023-11-07 ENCOUNTER — Other Ambulatory Visit: Payer: Self-pay | Admitting: Obstetrics and Gynecology

## 2023-11-07 DIAGNOSIS — Z1231 Encounter for screening mammogram for malignant neoplasm of breast: Secondary | ICD-10-CM

## 2023-11-29 ENCOUNTER — Ambulatory Visit
Admission: RE | Admit: 2023-11-29 | Discharge: 2023-11-29 | Disposition: A | Discharge status: Home / Self Care | Source: Ambulatory Visit | Attending: Obstetrics and Gynecology | Admitting: Obstetrics and Gynecology

## 2023-11-29 DIAGNOSIS — Z1231 Encounter for screening mammogram for malignant neoplasm of breast: Secondary | ICD-10-CM | POA: Diagnosis present
